# Patient Record
Sex: Male | Born: 1995
Health system: Southern US, Community
[De-identification: ages and names within clinical notes are randomized; demographics above are authoritative.]

## PROBLEM LIST (undated history)

## (undated) DIAGNOSIS — F909 Attention-deficit hyperactivity disorder, unspecified type: Secondary | ICD-10-CM

## (undated) HISTORY — DX: Attention-deficit hyperactivity disorder, unspecified type: F90.9

---
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
## 2016-11-11 ENCOUNTER — Ambulatory Visit (INDEPENDENT_AMBULATORY_CARE_PROVIDER_SITE_OTHER): Payer: BLUE CROSS/BLUE SHIELD | Admitting: Physician Assistant

## 2016-11-11 ENCOUNTER — Encounter: Payer: Self-pay | Admitting: Physician Assistant

## 2016-11-11 VITALS — BP 115/73 | HR 57 | Temp 98.3°F | Resp 16 | Ht 72.5 in | Wt 222.6 lb

## 2016-11-11 DIAGNOSIS — F909 Attention-deficit hyperactivity disorder, unspecified type: Secondary | ICD-10-CM | POA: Insufficient documentation

## 2016-11-11 DIAGNOSIS — L03211 Cellulitis of face: Secondary | ICD-10-CM

## 2016-11-11 DIAGNOSIS — F902 Attention-deficit hyperactivity disorder, combined type: Secondary | ICD-10-CM | POA: Diagnosis not present

## 2016-11-11 DIAGNOSIS — G4709 Other insomnia: Secondary | ICD-10-CM | POA: Diagnosis not present

## 2016-11-11 DIAGNOSIS — G4700 Insomnia, unspecified: Secondary | ICD-10-CM | POA: Insufficient documentation

## 2016-11-11 MED ORDER — METHYLPHENIDATE HCL ER (OSM) 18 MG PO TBCR: 18.0000 mg | EXTENDED_RELEASE_TABLET | Freq: Every day | ORAL | 0 refills | Status: DC

## 2016-11-11 MED ORDER — DOXYCYCLINE HYCLATE 100 MG PO CAPS: 100.0000 mg | ORAL_CAPSULE | Freq: Two times a day (BID) | ORAL | 0 refills | Status: AC

## 2016-11-11 NOTE — Patient Instructions (Signed)
     IF you received an x-ray today, you will receive an invoice from [facility]. Please contact [facility] at [phone] with questions or concerns regarding your invoice.   IF you received labwork today, you will receive an invoice from [facility]. Please contact [facility] at [phone] with questions or concerns regarding your invoice.   Our billing staff will not be able to assist you with questions regarding bills from these companies.  You will be contacted with the lab results as soon as they are available. The fastest way to get your results is to activate your My Chart account. Instructions are located on the last page of this paperwork. If you have not heard from us regarding the results in 2 weeks, please contact this office.     

## 2016-11-11 NOTE — Progress Notes (Signed)
Subjective:    Patient ID: Anthony Hahn, male    DOB: 03/14/1996, 21 y.o.   MRN: [id number]  HPI Presenting for swelling underneath his right eye. He is accompanied by his mother.  Patient woke up around 8-9AM with worsening discomfort and swelling beneath his right eye. Upon looking in the mirror, he saw that it was red. He has not taken any medications for this. Denies difficulty breathing, handling own secretions, or swelling elsewhere. Denies headache, ear pain, ear pressure, congestion, sneezing or coughing. Does endorse what feels like some water in his left ear.  When asked about yesterday's activities, patient reports going swimming in a pool for 2 hours in the early afternoon. He had never been to this pool before. He went to work yesterday evening feeling fine. Last night, he was able to fall asleep without difficulty. He notes this is abnormal for him given his h/o insomnia and ADHD.  He and his mother express concern regarding patient's insomnia. He often falls asleep around 7AM due to racing thoughts. He has tried counting sheep, relaxation methods, turning lights off around the house, etc. Mother used to give him melatonin to help.  He is no longer on ADHD medication (Daytrana patch) following a lapse in health insurance that made it too costly. He states his ADHD is hyperactive-inattentive type and is poorly controlled.   There are no active problems to display for this patient.  Prior to Admission medications   Medication Sig Start Date End Date Taking? Authorizing Provider  loratadine (CLARITIN) 10 MG tablet Take 10 mg by mouth daily as needed. For allergies.     [provider]  Multiple Vitamins-Minerals (MULTIVITAMINS THER. W/MINERALS) TABS Take 1 tablet by mouth daily.      [provider]   No Known Allergies    Review of Systems See above.     Objective:   Physical Exam  Constitutional: He appears well-developed and well-nourished.  BP  115/73   Pulse (!) 57   Temp 98.3 F (36.8 C) (Oral)   Resp 16   Ht 6' 0.5" (1.842 m)   Wt 222 lb 9.6 oz (101 kg)   SpO2 99%   BMI 29.78 kg/m    HENT:  Head: Normocephalic and atraumatic.  Right Ear: External ear normal.  Left Ear: External ear normal.  Did not perform otoscopic exam as otoscope light was out.  Eyes: Conjunctivae and EOM are normal. Pupils are equal, round, and reactive to light. Right eye exhibits no chemosis, no discharge, no exudate and no hordeolum. No foreign body present in the right eye. Left eye exhibits no chemosis, no discharge, no exudate and no hordeolum. No foreign body present in the left eye. Right conjunctiva is not injected. Left conjunctiva is not injected. No scleral icterus.  Semicircular area of erythema and edema with mild tenderness to palpation. 1x1 cm area of open skin at the 7 o' clock border with indurated edges - appears to be recently popped closed comedone. Scattered closed comedones and cystic acne on forehead, chin, and cheeks bilaterally.  Neck: Normal range of motion. Neck supple.  Cardiovascular: Normal rate, regular rhythm and normal heart sounds.   Pulmonary/Chest: Effort normal and breath sounds normal. No respiratory distress.  Lymphadenopathy:    He has no cervical adenopathy.  Neurological: He is alert.  Skin: Skin is warm and dry.  Psychiatric: He has a normal mood and affect. His behavior is normal.  Assessment & Plan:   1. Cellulitis, face Rx Doxycycline 100mg  oral twice daily x10 days. Encouraged application of hot compress to right eye. Follow  - doxycycline (VIBRAMYCIN) 100 MG capsule; Take 1 capsule (100 mg total) by mouth 2 (two) times daily.  Dispense: 20 capsule; Refill: 0  2. Attention deficit hyperactivity disorder (ADHD), combined type Advised calling if symptoms do not improve with methylphenidate tablets. Can increase dose or try switching to Daytrana patch, as this worked well for him as a child.  Referral given to [facility]. - methylphenidate 18 MG PO CR tablet; Take 1 tablet (18 mg total) by mouth daily.  Dispense: 30 tablet; Refill: 0 - Ambulatory referral to Psychiatry  3. Other insomnia Discussed optimal control of ADHD is likely to decrease insomnia. May pursue additional therapy following initiation of methylphenidate and psychiatry referral.

## 2016-11-11 NOTE — Assessment & Plan Note (Signed)
At least in part due to untreated ADHD. Anticipate some improvement with treatment.

## 2016-11-11 NOTE — Assessment & Plan Note (Signed)
Trial of oral methylphenidate CR 18 mg daily. [facility]er [facility]. If needed, I am happy to refill the stimulant or increase the dose until he gets in with specialty care.

## 2016-11-11 NOTE — Progress Notes (Signed)
Patient ID: Anthony Hahn, male    DOB: 1996-03-14, 21 y.o.   MRN: [id number]  PCP: Patient, No Pcp Per  Chief Complaint  Patient presents with  . Facial Swelling    Around right eye - began this am    Subjective:   Presents for evaluation of swelling under the RIGHT eye. He is accompanied by his mother.  He awoke this AM with swelling and discomfort under the eye. He looked in the mirror and saw that it was red. He applied nothing, and took noting to alleviate his symptoms. Relates that he may have squeezed what he thought was a pimple in this area, but nothing came out.  No new products, foods, medications. No exposures to chemicals that he can recall. He went swimming yesterday at a pool he'd not been to previously.  He'd also like to talk about chronic insomnia. This has been an issue all his life. His mother struggled when he was a younger child, and throughout his adolescent. She relates a time when he was in grade school and didn't sleep for 3 days straight. He continued to attend school and exercise, even lifting his bed frame in order to burn off excess energy. She wasn't able to lift the frame herself.  Each thing they've tried for this problem has worked briefly, then become ineffective after several days.  Mom recalls that when he was on stimulant therapy for ADHD, his sleep was some improved. Daytrana worked best for him, and it was stopped when he was temporarily uninsured. Mom relates that the oral formulations were ineffective, and that she finds transdermal formulations work better for her as well. The patient is not interested in transdermal products, recalling skin irritation as a child from the patches.   ADHD symptoms are negatively impacting his life even now. He has difficulty completing tasks and staying on track. He is interested in resuming treatment.   Review of Systems  Constitutional: Negative for chills and fever.  Eyes: Positive for redness  (lower eyelid).  Respiratory: Negative for cough and shortness of breath.   Cardiovascular: Negative for chest pain, palpitations and leg swelling.  Gastrointestinal: Negative for diarrhea, nausea and vomiting.  Endocrine: Negative for polydipsia.  Genitourinary: Negative for dysuria, frequency and urgency.  Musculoskeletal: Negative for myalgias.  Skin: Positive for color change and wound. Negative for rash.  Neurological: Negative for dizziness and headaches.  Psychiatric/Behavioral: Positive for decreased concentration and sleep disturbance. Negative for agitation, confusion, dysphoric mood, hallucinations, self-injury and suicidal ideas. The patient is hyperactive. The patient is not nervous/anxious.      Patient Active Problem List   Diagnosis Date Noted  . Attention deficit hyperactivity disorder (ADHD) 11/11/2016  . Insomnia 11/11/2016     Prior to Admission medications   Medication Sig Start Date End Date Taking? Authorizing Provider  loratadine (CLARITIN) 10 MG tablet Take 10 mg by mouth daily as needed. For allergies.     [provider]  Multiple Vitamins-Minerals (MULTIVITAMINS THER. W/MINERALS) TABS Take 1 tablet by mouth daily.      [provider]     No Known Allergies     Objective:  Physical Exam  Constitutional: He is oriented to person, place, and time. He appears well-developed and well-nourished. He is active and cooperative. No distress.  BP 115/73   Pulse (!) 57   Temp 98.3 F (36.8 C) (Oral)   Resp 16   Ht 6' 0.5" (1.842 m)   Wt  222 lb 9.6 oz (101 kg)   SpO2 99%   BMI 29.78 kg/m   HENT:  Head: Normocephalic and atraumatic.    Right Ear: Hearing normal.  Left Ear: Hearing normal.  Eyes: Conjunctivae are normal. No scleral icterus.  Neck: Normal range of motion. Neck supple. No thyromegaly present.  Cardiovascular: Normal rate, regular rhythm and normal heart sounds.   Pulses:      Radial pulses are 2+ on the right side,  and 2+ on the left side.  Pulmonary/Chest: Effort normal and breath sounds normal.  Lymphadenopathy:       Head (right side): No tonsillar, no preauricular, no posterior auricular and no occipital adenopathy present.       Head (left side): No tonsillar, no preauricular, no posterior auricular and no occipital adenopathy present.    He has no cervical adenopathy.       Right: No supraclavicular adenopathy present.       Left: No supraclavicular adenopathy present.  Neurological: He is alert and oriented to person, place, and time. No sensory deficit.  Skin: Skin is warm, dry and intact. Lesion (multiple scattered open comedones and cystic acne lesions.) noted. No rash noted. No cyanosis or erythema. Nails show no clubbing.  Psychiatric: He has a normal mood and affect. His speech is normal and behavior is normal.       Assessment & Plan:   Problem List Items Addressed This Visit    Attention deficit hyperactivity disorder (ADHD)    Trial of oral methylphenidate CR 18 mg daily. Refer to [facility]. If needed, I am happy to refill the stimulant or increase the dose until he gets in with specialty care.      Relevant Medications   methylphenidate 18 MG PO CR tablet   Other Relevant Orders   Ambulatory referral to Psychiatry   Insomnia    At least in part due to untreated ADHD. Anticipate some improvement with treatment.       Other Visit Diagnoses    Cellulitis, face    -  Primary   Doxycycline. Warm compresses. Local wound care. RTC if worsens/persists.   Relevant Medications   doxycycline (VIBRAMYCIN) 100 MG capsule       No Follow-up on file.   Fernande Brashelle S. Bralyn Folkert, PA-C [facility]

## 2016-11-15 ENCOUNTER — Telehealth: Payer: Self-pay

## 2016-11-15 NOTE — Telephone Encounter (Signed)
PA Form faxed Awaiting response

## 2016-11-15 NOTE — Telephone Encounter (Signed)
PA Started. Form Placed in box Please see highlighted items

## 2016-11-23 NOTE — Telephone Encounter (Signed)
PA for Methylphenidate Appro[address] [phone]

## 2016-12-07 ENCOUNTER — Telehealth: Payer: Self-pay | Admitting: Physician Assistant

## 2016-12-07 MED ORDER — METHYLPHENIDATE HCL ER (OSM) 18 MG PO TBCR: 18.0000 mg | EXTENDED_RELEASE_TABLET | Freq: Every day | ORAL | 0 refills | Status: DC

## 2016-12-07 NOTE — Telephone Encounter (Signed)
Fax from the insurance that methylphenidate er has been denied because she has not tried Concerta (same medication, BRAND NAME).  As such, I've printed that for her to try. If it doesn't work, or if she has side effects, then we can try again.  Meds ordered this encounter  Medications  . methylphenidate (CONCERTA) 18 MG PO CR tablet    Sig: Take 1 tablet (18 mg total) by mouth daily.    Dispense:  30 tablet    Refill:  0    Order Specific Question:   Supervising Provider    Answer:   Clelia CroftSHAW, EVA N [[id number]]

## 2016-12-09 ENCOUNTER — Telehealth: Payer: Self-pay

## 2016-12-09 NOTE — Telephone Encounter (Signed)
Left voicemail for pt stating Rx is ready for pickup.

## 2016-12-09 NOTE — Telephone Encounter (Signed)
Concerta 18 MG ready for pickup at the [facility] [address]. Notified the pt via voicemail.

## 2017-02-09 MED ORDER — METHYLPHENIDATE HCL ER (OSM) 18 MG PO TBCR: 18.0000 mg | EXTENDED_RELEASE_TABLET | Freq: Every day | ORAL | 0 refills | Status: AC

## 2017-02-09 NOTE — Telephone Encounter (Signed)
Located prescription printed 12/07/2016 and never picked up. Destroyed, with Jonny Ruiz, RN as witness.  Mom reports that they had an appointment at [facility], which was cancelled by the specialty office. She doesn't know why and did not call back to schedule. She is undergoing treatment for cancer.

## 2017-02-09 NOTE — Telephone Encounter (Signed)
Mother presents to pick up Concerta Rx, printed 12/07/2016.  Unable to check [place], as that site is currently down.  Reviewed signature page in our office, but the prescription was not documented in the book for signature.  When I saw him in June, he was referred to [facility], and provided with a 30-day supply of generic methylphenidate (Concerta). There was a prior authorization issue with his insurance, and they required that he fail BRAND NAME Concerta before authorizing generic methylphenidate. That prescription was printed 12/07/2016.  Left a message at [facility] inquiring about the referral (was it received? Scheduled? Outcome?).  Called his pharmacy of record, and they have not filled this product for him, ever.  The pharmacist was kind enough to access the [place] site.  No fill dates in the past 24 months.  Meds ordered this encounter  Medications  . methylphenidate (CONCERTA) 18 MG PO CR tablet    Sig: Take 1 tablet (18 mg total) by mouth daily.    Dispense:  30 tablet    Refill:  0    Order Specific Question:   Supervising Provider    Answer:   Clelia Croft, EVA N [[id number]]

## 2017-02-09 NOTE — Addendum Note (Signed)
Addended by: Fernande Bras on: 02/09/2017 02:11 PM   Modules accepted: Orders

## 2017-02-11 ENCOUNTER — Telehealth: Payer: Self-pay | Admitting: Physician Assistant

## 2017-02-11 NOTE — Telephone Encounter (Signed)
FYI patient was a NO SHOW to her appointment

## 2017-02-11 NOTE — Telephone Encounter (Signed)
Please call this patient's mother and offer to help her get the patient rescheduled. I think that she is being treated for cancer, so she may be really busy with treatments, etc and dealing with her own illness.

## 2017-02-11 NOTE — Telephone Encounter (Signed)
Baird Lyons called from [facility] stated that pt no showed the appt and they can not get in contact with pt and has closed the referral.. Have any questions call casey at [phone]

## 2017-02-14 NOTE — Telephone Encounter (Signed)
Thanks

## 2017-02-16 ENCOUNTER — Telehealth: Payer: Self-pay | Admitting: Family Medicine

## 2017-02-16 NOTE — Telephone Encounter (Signed)
LEFT MESSAGE ON MOTHERS PHONE FOR HER TO GIVE [place] A CALL BACK TO TRY TO RESCHEDULE DAUGHTER REFERRAL SHE WAS A NO SHOW AT [facility] THEY CLOSED THE REFERRAL

## 2017-08-18 ENCOUNTER — Encounter: Payer: Self-pay | Admitting: Physician Assistant

## 2017-09-19 ENCOUNTER — Encounter: Payer: Self-pay | Admitting: Podiatry

## 2017-09-19 ENCOUNTER — Ambulatory Visit: Payer: BLUE CROSS/BLUE SHIELD | Admitting: Podiatry

## 2017-09-19 VITALS — BP 118/67 | HR 51

## 2017-09-19 DIAGNOSIS — B079 Viral wart, unspecified: Secondary | ICD-10-CM

## 2017-09-19 DIAGNOSIS — L84 Corns and callosities: Secondary | ICD-10-CM

## 2017-09-21 NOTE — Progress Notes (Signed)
Subjective:   Patient ID: Anthony Hahn, male   DOB: 22 y.o.   MRN: [id number]   HPI Patient presents stating he has a painful lesion on his left big toe that is been present for over a year he is tried to trim it and use over-the-counter medicines.  Patient does not smoke and likes to be active   Review of Systems  All other systems reviewed and are negative.       Objective:  Physical Exam  Constitutional: He appears well-developed and well-nourished.  Cardiovascular: Intact distal pulses.  Pulmonary/Chest: Effort normal.  Musculoskeletal: Normal range of motion.  Neurological: He is alert.  Skin: Skin is warm.  Nursing note and vitals reviewed.   Neurovascular status intact muscle strength is adequate range of motion within normal limits with keratotic lesion noted at the inner phalangeal joint left big toe with upon debridement lucent core noted within the lesion.  It is moderately painful to dorsal pressure and lateral pressure and patient was noted to have good digital perfusion and is well oriented x3     Assessment:  Keratotic lesion secondary to pressure with possibility of wart or porokeratotic type lesion     Plan:  H&P condition reviewed and deep debridement accomplished and went ahead today applied salicylic acid prep with sterile dressing we will see response to decide what else may be appropriate for this.  Reviewed x-rays with patient  X-rays indicate that there is no signs of interphalangeal sesamoid or other bone pathology

## 2018-12-23 ENCOUNTER — Emergency Department (HOSPITAL_COMMUNITY)
Admission: EM | Admit: 2018-12-23 | Discharge: 2018-12-24 | Disposition: A | Discharge status: Home / Self Care | Payer: Self-pay | Attending: Emergency Medicine | Admitting: Emergency Medicine

## 2018-12-23 ENCOUNTER — Other Ambulatory Visit: Payer: Self-pay

## 2018-12-23 ENCOUNTER — Encounter (HOSPITAL_COMMUNITY): Payer: Self-pay | Admitting: Emergency Medicine

## 2018-12-23 DIAGNOSIS — J45909 Unspecified asthma, uncomplicated: Secondary | ICD-10-CM | POA: Insufficient documentation

## 2018-12-23 DIAGNOSIS — F1210 Cannabis abuse, uncomplicated: Secondary | ICD-10-CM | POA: Insufficient documentation

## 2018-12-23 DIAGNOSIS — R112 Nausea with vomiting, unspecified: Secondary | ICD-10-CM | POA: Insufficient documentation

## 2018-12-23 DIAGNOSIS — R1013 Epigastric pain: Secondary | ICD-10-CM | POA: Insufficient documentation

## 2018-12-23 LAB — CBC
HCT: 42.6 % (ref 39.0–52.0)
Hemoglobin: 13.4 g/dL (ref 13.0–17.0)
MCH: 27.1 pg (ref 26.0–34.0)
MCHC: 31.5 g/dL (ref 30.0–36.0)
MCV: 86.2 fL (ref 80.0–100.0)
Platelets: 292 10*3/uL (ref 150–400)
RBC: 4.94 MIL/uL (ref 4.22–5.81)
RDW: 13.1 % (ref 11.5–15.5)
WBC: 7.6 10*3/uL (ref 4.0–10.5)
nRBC: 0 % (ref 0.0–0.2)

## 2018-12-23 LAB — COMPREHENSIVE METABOLIC PANEL
ALT: 34 U/L (ref 0–44)
AST: 32 U/L (ref 15–41)
Albumin: 4.2 g/dL (ref 3.5–5.0)
Alkaline Phosphatase: 57 U/L (ref 38–126)
Anion gap: 9 (ref 5–15)
BUN: 14 mg/dL (ref 6–20)
CO2: 27 mmol/L (ref 22–32)
Calcium: 9.1 mg/dL (ref 8.9–10.3)
Chloride: 104 mmol/L (ref 98–111)
Creatinine, Ser: 1.11 mg/dL (ref 0.61–1.24)
GFR calc Af Amer: >60 mL/min (ref >60)
GFR calc non Af Amer: >60 mL/min (ref >60)
Glucose, Bld: 91 mg/dL (ref 70–99)
Potassium: 3.7 mmol/L (ref 3.5–5.1)
Sodium: 140 mmol/L (ref 135–145)
Total Bilirubin: 0.5 mg/dL (ref 0.3–1.2)
Total Protein: 7.2 g/dL (ref 6.5–8.1)

## 2018-12-23 LAB — URINALYSIS, ROUTINE W REFLEX MICROSCOPIC
Bilirubin Urine: NEGATIVE
Glucose, UA: NEGATIVE mg/dL
Hgb urine dipstick: NEGATIVE
Ketones, ur: NEGATIVE mg/dL
Leukocytes,Ua: NEGATIVE
Nitrite: NEGATIVE
Protein, ur: NEGATIVE mg/dL
Specific Gravity, Urine: 1.017 (ref 1.005–1.030)
pH: 6 (ref 5.0–8.0)

## 2018-12-23 LAB — LIPASE, BLOOD: Lipase: 28 U/L (ref 11–51)

## 2018-12-23 MED ORDER — SODIUM CHLORIDE 0.9% FLUSH: 3.0000 mL | Freq: Once | INTRAVENOUS | Status: DC

## 2018-12-23 MED ORDER — PROMETHAZINE HCL 25 MG PO TABS: 25.0000 mg | ORAL_TABLET | Freq: Four times a day (QID) | ORAL | 0 refills | Status: AC | PRN

## 2018-12-23 NOTE — ED Triage Notes (Signed)
Pt reports sudden onset of vomiting with last episode at 2030 today. Pt reports emesis was liquid green in color.

## 2018-12-23 NOTE — Discharge Instructions (Signed)
Work up in the emergency department was reassuring. I suspect your symptoms are due to a viral infection or food poisoning. Symptoms of uncomplicated viral gastrointestinal infections usually last 24-48 hours, and slowly improve after 2-3 days.    Treatment includes symptoms control, oral hydration, prevention of spread of illness.   Phenerganfor nausea every 8 hours for the next 24 hours to avoid vomiting and tolerate fluids.  Acetaminophen (tylenol) for body aches, abdominal pain. Ibuprofen (advil, aleve, motrin) or aspirin products can be irritating to stomach, avoid these until you feel better.  Stay well hydrated with 1-2 L of water daily.  Start with bland, mild diet avoiding hot, spicy, greasy foods until you start to feel better and then can transition to normal diet. Avoid fruit or fruit juices as these can worsen diarrhea.  Norovirus and other stomach bugs are killed by alcohol and standard cleaning agents. Wash hands often and do not share utensils or drinks to avoid spread to family members.  Return for worsening abdominal pain, localized right upper or right lower abdominal pain, chest pain, shortness of breath, inability to tolerating fluids by mouth despite nausea medication, localized abdominal pain, blood in vomit or stool, blood in your urine, burning with urination, fever  See below for community resources where you may get tested for COVID   Link to [facility] Department for COVID testing and information: [employer] No cost COVID-19 test available  Website includes testing dates, times, resources  Health department may be able to do "administrative" COVID tests to return/obtain clearance to work  Link to [employer] for COVID-19: [employer] Website includes contact information and addresses, testing requirements and  cost Hours: Monday - Friday, 8 a.m. - 3:30 p.m   Local pharmacies (I[facility]e: [facility], [facility]) also offering COVID testing with some cost

## 2018-12-24 NOTE — ED Provider Notes (Signed)
[facility]-EMERGENCY DEPT Provider Note   CSN: [id number] Arrival date & time: 12/23/18  2106    History   Chief Complaint Chief Complaint  Patient presents with  . Emesis    HPI Anthony Hahn is a 23 y.o. male presents to ER for evaluation of sudden onset nausea, vomiting that happened around 2030 today while at work.  Patient states all of a sudden he had intense nausea and had 3-4 episodes of vomiting.  Nonbloody nonbilious, it was clear/yellow-tinged.  Since vomiting he feels better but has had intermittent bouts of nausea but no repeat emesis.  4 to 6 hours prior to the symptoms he had some fish that was well cooked.  Has had associated mild epigastric abdominal cramping but none currently.  He denies other recent exposures to suspicious or undercooked foods.  No sick contacts.  He denies any fevers, chills, diarrhea, constipation, dysuria.  No interventions.  No alleviating factors.  He is tolerating fluids.  Patient states that his boss told him he should get checked out because of COVID-19 pandemic.  Patient states that 3 or 4 of his coworkers have had COVID-19 but since he has gotten a [employer] tested and it has been negative.  No previous abdominal surgeries.     HPI  Past Medical History:  Diagnosis Date  . ADHD   . Asthma    pollen-induced    Patient Active Problem List   Diagnosis Date Noted  . Attention deficit hyperactivity disorder (ADHD) 11/11/2016  . Insomnia 11/11/2016    Past Surgical History:  Procedure Laterality Date  . ORIF ANKLE FRACTURE  04/16/2011   Procedure: OPEN REDUCTION INTERNAL FIXATION (ORIF) ANKLE FRACTURE;  Surgeon: Velna OchsPeter G Dalldorf, MD;  Location: [facility] OR;  Service: Orthopedics;  Laterality: Left;        Home Medications    Prior to Admission medications   Medication Sig Start Date End Date Taking? Authorizing Provider  loratadine (CLARITIN) 10 MG tablet Take 10 mg by mouth daily as needed. For allergies.      [provider]  methylphenidate (CONCERTA) 18 MG PO CR tablet Take 1 tablet (18 mg total) by mouth daily. Patient not taking: Reported on 09/19/2017 02/09/17   Porfirio OarJeffery, Chelle, PA  Multiple Vitamins-Minerals (MULTIVITAMINS THER. W/MINERALS) TABS Take 1 tablet by mouth daily.      [provider]  promethazine (PHENERGAN) 25 MG tablet Take 1 tablet (25 mg total) by mouth every 6 (six) hours as needed for nausea or vomiting. 12/23/18   Liberty HandyGibbons, Claudia J, PA-C    Family History History reviewed. No pertinent family history.  Social History Social History   Tobacco Use  . Smoking status: Never Smoker  . Smokeless tobacco: Never Used  Substance Use Topics  . Alcohol use: Yes  . Drug use: Yes    Types: Marijuana     Allergies   Patient has no known allergies.   Review of Systems Review of Systems  Gastrointestinal: Positive for abdominal pain, nausea and vomiting.  All other systems reviewed and are negative.    Physical Exam Updated Vital Signs BP (!) 142/84   Pulse 67   Temp 98.6 F (37 C) (Oral)   Resp 16   Ht 6\' 3"  (1.905 m)   Wt 84.4 kg   SpO2 98%   BMI 23.25 kg/m   Physical Exam Vitals signs and nursing note reviewed.  Constitutional:      Appearance: He is well-developed.  Comments: Non toxic.  HENT:     Head: Normocephalic and atraumatic.     Nose: Nose normal.  Eyes:     Conjunctiva/sclera: Conjunctivae normal.  Neck:     Musculoskeletal: Normal range of motion.  Cardiovascular:     Rate and Rhythm: Normal rate and regular rhythm.     Heart sounds: Normal heart sounds.  Pulmonary:     Effort: Pulmonary effort is normal.     Breath sounds: Normal breath sounds.  Abdominal:     General: Bowel sounds are normal.     Palpations: Abdomen is soft.     Tenderness: There is no abdominal tenderness.     Comments: No G/R/R. No suprapubic or CVA tenderness. Negative Murphy's and McBurney's  Musculoskeletal: Normal range of motion.   Skin:    General: Skin is warm and dry.     Capillary Refill: Capillary refill takes less than 2 seconds.  Neurological:     Mental Status: He is alert.  Psychiatric:        Behavior: Behavior normal.      ED Treatments / Results  Labs (all labs ordered are listed, but only abnormal results are displayed) Labs Reviewed  LIPASE, BLOOD  COMPREHENSIVE METABOLIC PANEL  CBC  URINALYSIS, ROUTINE W REFLEX MICROSCOPIC    EKG None  Radiology No results found.  Procedures Procedures (including critical care time)  Medications Ordered in ED Medications  sodium chloride flush (NS) 0.9 % injection 3 mL (0 mLs Intravenous Hold 12/23/18 2248)     Initial Impression / Assessment and Plan / ED Course  I have reviewed the triage vital signs and the nursing notes.  Pertinent labs & imaging results that were available during my care of the patient were reviewed by me and considered in my medical decision making (see chart for details).  Highest on ddx is viral process, gastroenteritis.    Less likely gastritis, GERD, COVID.    Exam reassuring with no abdominal tenderness, peritonitis, negative Murphy's and McBurney's.    No red flag symptoms of n/v such as fever, hematemesis, melena, signs of severe dehydration.  No recent abx or travel.    ER work up reviewed by me. Electrolytes, liver function tests, lipase and CBC are all normal today.   Low suspicion for intra-abdominal/pelvic emergency. Patient tolerated fluids in the ED. Patient did not have episodes of emesis in the ED.  Will defer IVF given tolerance of PO.  Given  Benign symptoms, labs I don't think emergent imaging or admission is indicated. Re-evaluation reassuring. Patient will be discharged at this time with antiemetic, oral hydration, diet changes.  Precautions given. Return precautions discussed.   Final Clinical Impressions(s) / ED Diagnoses   Final diagnoses:  Nausea and vomiting in adult    ED Discharge Orders          Ordered    promethazine (PHENERGAN) 25 MG tablet  Every 6 hours PRN     12/23/18 2355           Kinnie Feil, PA-C 12/24/18 0154    Quintella Reichert, MD 12/24/18 [id number]

## 2019-04-23 ENCOUNTER — Other Ambulatory Visit: Payer: Self-pay

## 2019-04-23 DIAGNOSIS — Z20822 Contact with and (suspected) exposure to covid-19: Secondary | ICD-10-CM

## 2019-04-24 LAB — NOVEL CORONAVIRUS, NAA: SARS-CoV-2, NAA: NOT DETECTED

## 2019-09-20 ENCOUNTER — Ambulatory Visit: Payer: Self-pay | Attending: Internal Medicine

## 2019-09-20 DIAGNOSIS — Z23 Encounter for immunization: Secondary | ICD-10-CM

## 2019-09-20 NOTE — Progress Notes (Signed)
   Covid-19 Vaccination Clinic  Name:  QUINTERIOUS WALRAVEN    MRN: [id number] DOB: 1995-06-06  09/20/2019  Mr. Zou was observed post Covid-19 immunization for 15 minutes without incident. He was provided with Vaccine Information Sheet and instruction to access the V-Safe system.   Mr. Caratachea was instructed to call 911 with any severe reactions post vaccine: Marland Kitchen Difficulty breathing  . Swelling of face and throat  . A fast heartbeat  . A bad rash all over body  . Dizziness and weakness   Immunizations Administered    Name Date Dose VIS Date Route   Pfizer COVID-19 Vaccine 09/20/2019 11:10 AM 0.3 mL 07/11/2018 Intramuscular   Manufacturer: [employer], [employer]   Lot: [id number]   NDC: [address]-6

## 2019-10-16 ENCOUNTER — Ambulatory Visit: Payer: Self-pay | Attending: Internal Medicine

## 2019-10-16 DIAGNOSIS — Z23 Encounter for immunization: Secondary | ICD-10-CM

## 2019-10-16 NOTE — Progress Notes (Signed)
   Covid-19 Vaccination Clinic  Name:  Anthony Hahn    MRN: [id number] DOB: [birth year]  10/16/2019  Anthony Hahn was observed post Covid-19 immunization for 15 minutes without incident. He was provided with Vaccine Information Sheet and instruction to access the V-Safe system.   Anthony Hahn was instructed to call 911 with any severe reactions post vaccine: Marland Kitchen Difficulty breathing  . Swelling of face and throat  . A fast heartbeat  . A bad rash all over body  . Dizziness and weakness   Immunizations Administered    Name Date Dose VIS Date Route   Pfizer COVID-19 Vaccine 10/16/2019 10:50 AM 0.3 mL 07/11/2018 Intramuscular   Manufacturer: [employer], [employer]   Lot: [id number]   NDC: [address]-4

## 2020-03-03 ENCOUNTER — Other Ambulatory Visit: Payer: Self-pay

## 2020-03-03 DIAGNOSIS — W540XXA Bitten by dog, initial encounter: Secondary | ICD-10-CM | POA: Insufficient documentation

## 2020-03-03 DIAGNOSIS — J45909 Unspecified asthma, uncomplicated: Secondary | ICD-10-CM | POA: Insufficient documentation

## 2020-03-03 DIAGNOSIS — S61251A Open bite of left index finger without damage to nail, initial encounter: Secondary | ICD-10-CM | POA: Insufficient documentation

## 2020-03-04 ENCOUNTER — Encounter (HOSPITAL_COMMUNITY): Payer: Self-pay | Admitting: Emergency Medicine

## 2020-03-04 ENCOUNTER — Emergency Department (HOSPITAL_COMMUNITY)
Admission: EM | Admit: 2020-03-04 | Discharge: 2020-03-04 | Disposition: A | Discharge status: Home / Self Care | Payer: Self-pay | Attending: Emergency Medicine | Admitting: Emergency Medicine

## 2020-03-04 ENCOUNTER — Emergency Department (HOSPITAL_COMMUNITY): Payer: Self-pay

## 2020-03-04 ENCOUNTER — Other Ambulatory Visit: Payer: Self-pay

## 2020-03-04 DIAGNOSIS — W540XXA Bitten by dog, initial encounter: Secondary | ICD-10-CM

## 2020-03-04 MED ORDER — IBUPROFEN 800 MG PO TABS
800.0000 mg | ORAL_TABLET | Freq: Once | ORAL | Status: AC
  Administered 2020-03-04: 800 mg via ORAL
  Filled 2020-03-04: qty 1

## 2020-03-04 MED ORDER — AMOXICILLIN-POT CLAVULANATE 875-125 MG PO TABS: 1.0000 | ORAL_TABLET | Freq: Two times a day (BID) | ORAL | 0 refills | Status: AC

## 2020-03-04 MED ORDER — SODIUM CHLORIDE 0.9 % IV SOLN
3.0000 g | Freq: Once | INTRAVENOUS | Status: AC
  Administered 2020-03-04: 3 g via INTRAVENOUS
  Filled 2020-03-04: qty 8

## 2020-03-04 MED ORDER — IBUPROFEN 800 MG PO TABS: 800.0000 mg | ORAL_TABLET | Freq: Three times a day (TID) | ORAL | 0 refills | Status: AC

## 2020-03-04 NOTE — ED Triage Notes (Signed)
Patient was bitten by a dog that has all his shots. Patient base of left index and middle finger is swollen. Patient also has bites on both of the fingers. Patient states that he got bit on this past Friday.

## 2020-03-04 NOTE — ED Provider Notes (Signed)
[facility]-EMERGENCY DEPT Provider Note   CSN: [id number] Arrival date & time: 03/03/20  2240     History Chief Complaint  Patient presents with  . Animal Bite    left index and middle finger    Anthony Hahn is a 24 y.o. male.  Patient presents to the emergency department with a chief complaint of dog bite.  He states that he was bitten by his dog on Friday (4 days ago).  The dog is current on its rabies vaccine.  Patient reports that he has had some redness and swelling around the bite wound today.  He reports moderate pain.  He denies any fevers or chills.  He has been applying topical antibiotic ointment and a bandage.  He denies any other associated symptoms.  The history is provided by the patient. No language interpreter was used.       Past Medical History:  Diagnosis Date  . ADHD   . Asthma    pollen-induced    Patient Active Problem List   Diagnosis Date Noted  . Attention deficit hyperactivity disorder (ADHD) 11/11/2016  . Insomnia 11/11/2016    Past Surgical History:  Procedure Laterality Date  . ORIF ANKLE FRACTURE  04/16/2011   Procedure: OPEN REDUCTION INTERNAL FIXATION (ORIF) ANKLE FRACTURE;  Surgeon: Velna Ochs, MD;  Location: [facility] OR;  Service: Orthopedics;  Laterality: Left;       History reviewed. No pertinent family history.  Social History   Tobacco Use  . Smoking status: Never Smoker  . Smokeless tobacco: Never Used  Substance Use Topics  . Alcohol use: Yes  . Drug use: Yes    Types: Marijuana    Home Medications Prior to Admission medications   Medication Sig Start Date End Date Taking? Authorizing Provider  loratadine (CLARITIN) 10 MG tablet Take 10 mg by mouth daily as needed. For allergies.     [provider]  methylphenidate (CONCERTA) 18 MG PO CR tablet Take 1 tablet (18 mg total) by mouth daily. Patient not taking: Reported on 09/19/2017 02/09/17   Porfirio Oar, PA  Multiple  Vitamins-Minerals (MULTIVITAMINS THER. W/MINERALS) TABS Take 1 tablet by mouth daily.      [provider]  promethazine (PHENERGAN) 25 MG tablet Take 1 tablet (25 mg total) by mouth every 6 (six) hours as needed for nausea or vomiting. 12/23/18   Liberty Handy, PA-C    Allergies    Patient has no known allergies.  Review of Systems   Review of Systems  All other systems reviewed and are negative.   Physical Exam Updated Vital Signs BP (!) 151/91 (BP Location: Right Arm)   Pulse 62   Temp 98.9 F (37.2 C) (Oral)   Resp 16   Ht 6\' 3"  (1.905 m)   Wt 81.6 kg   SpO2 98%   BMI 22.50 kg/m   Physical Exam Vitals and nursing note reviewed.  Constitutional:      General: He is not in acute distress.    Appearance: He is well-developed. He is not ill-appearing.  HENT:     Head: Normocephalic and atraumatic.  Eyes:     Conjunctiva/sclera: Conjunctivae normal.  Cardiovascular:     Rate and Rhythm: Normal rate.  Pulmonary:     Effort: Pulmonary effort is normal. No respiratory distress.  Abdominal:     General: There is no distension.  Musculoskeletal:     Cervical back: Neck supple.     Comments: Moves all  extremities  Skin:    General: Skin is warm and dry.     Comments: 1.5 cm laceration to the left hand at the base of the second digit near the MCP joint with some cloudy discharge, mild erythema extending peripherally from this laceration  Neurological:     Mental Status: He is alert and oriented to person, place, and time.  Psychiatric:        Mood and Affect: Mood normal.        Behavior: Behavior normal.       ED Results / Procedures / Treatments   Labs (all labs ordered are listed, but only abnormal results are displayed) Labs Reviewed - No data to display  EKG None  Radiology DG Hand Complete Left  Result Date: 03/04/2020 CLINICAL DATA:  24 year old male status post dog bite on 02/29/2020. Increased hand swelling and pain. EXAM: LEFT HAND -  COMPLETE 3+ VIEW COMPARISON:  None. FINDINGS: Bone mineralization is within normal limits. There is no evidence of fracture or dislocation. There is no evidence of arthropathy or other focal bone abnormality. Generalized soft tissue swelling. No soft tissue gas. No radiopaque foreign body identified. IMPRESSION: Soft tissue swelling with no acute osseous abnormality or radiopaque foreign body identified. Electronically Signed   By: Odessa Fleming M.D.   On: 03/04/2020 01:39    Procedures Procedures (including critical care time)  Medications Ordered in ED Medications  Ampicillin-Sulbactam (UNASYN) 3 g in sodium chloride 0.9 % 100 mL IVPB (has no administration in time range)  ibuprofen (ADVIL) tablet 800 mg (has no administration in time range)    ED Course  I have reviewed the triage vital signs and the nursing notes.  Pertinent labs & imaging results that were available during my care of the patient were reviewed by me and considered in my medical decision making (see chart for details).    MDM Rules/Calculators/A&P                          Patient with dog bite that occurred 4 days ago.  He has a small laceration with some surrounding erythema.  When I press firmly around the wound, there is a scant amount of cloudy discharge that is able to be expressed, but further palpation did not elicit any further discharge.  At this time, do not feel that further incision or exploration is needed, but I do think that the patient needs to be placed on antibiotics.  The injury is on the posterior aspect of the hand, I have a low suspicion for septic joint or for flexor tenosynovitis.  I will give him a dose of Unasyn in the ED and discharge him home on Augmentin.  The dog is current on its rabies shots.  Patient will be placed in a finger splint to limit mobility.  I have advised him to keep his hand elevated.  I have marked the area with a skin marker.  Strict return precautions have been given. Final  Clinical Impression(s) / ED Diagnoses Final diagnoses:  Dog bite, initial encounter    Rx / DC Orders ED Discharge Orders         Ordered    amoxicillin-clavulanate (AUGMENTIN) 875-125 MG tablet  Every 12 hours        03/04/20 0247    ibuprofen (ADVIL) 800 MG tablet  3 times daily        03/04/20 0247  Roxy Horseman, PA-C 03/04/20 0248    Dione Booze, MD 03/04/20 [phone]

## 2020-03-04 NOTE — Discharge Instructions (Signed)
Return to the emergency department if your symptoms significantly worsen.  You may notice slight worsening over the next 24 to 48 hours, but the antibiotic should limit this.  You should begin improving significantly after ~48 hours with the antibiotic.

## 2020-03-05 ENCOUNTER — Ambulatory Visit: Payer: Self-pay

## 2020-12-18 ENCOUNTER — Other Ambulatory Visit: Payer: Self-pay

## 2020-12-18 ENCOUNTER — Emergency Department (HOSPITAL_COMMUNITY)
Admission: EM | Admit: 2020-12-18 | Discharge: 2020-12-18 | Disposition: A | Discharge status: Home / Self Care | Payer: Self-pay | Attending: Emergency Medicine | Admitting: Emergency Medicine

## 2020-12-18 ENCOUNTER — Emergency Department (HOSPITAL_COMMUNITY): Payer: Self-pay

## 2020-12-18 ENCOUNTER — Encounter (HOSPITAL_COMMUNITY): Payer: Self-pay | Admitting: *Deleted

## 2020-12-18 DIAGNOSIS — M93279 Osteochondritis dissecans, unspecified ankle and joints of foot: Secondary | ICD-10-CM | POA: Insufficient documentation

## 2020-12-18 DIAGNOSIS — M79641 Pain in right hand: Secondary | ICD-10-CM | POA: Insufficient documentation

## 2020-12-18 DIAGNOSIS — M899 Disorder of bone, unspecified: Secondary | ICD-10-CM

## 2020-12-18 DIAGNOSIS — J45909 Unspecified asthma, uncomplicated: Secondary | ICD-10-CM | POA: Insufficient documentation
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
## 2020-12-18 NOTE — Discharge Instructions (Addendum)
You are seen in the ER today for your hand pain.  You are found to have findings concerning for injury to the tendon or underlying bone of your knuckle.  As this injury occurred approximately 4 months ago, there is no intervention required today, but do recommend he follow-up with Dr. Orlan Leavens, the hand specialist listed below.  Please call his office to schedule a follow-up appointment.  You may continue to ice the joint, use over-the-counter medications for pain as needed, and I would recommend that you avoid any activities that significantly worsen your pain such as boxing as you mentioned throughout our discussion.  Return to the ER if develop new numbness, ting, weakness in the hand, or any other new severe symptoms.

## 2021-04-22 ENCOUNTER — Encounter (HOSPITAL_COMMUNITY): Payer: Self-pay | Admitting: Oncology

## 2021-04-22 ENCOUNTER — Emergency Department (HOSPITAL_COMMUNITY)
Admission: EM | Admit: 2021-04-22 | Discharge: 2021-04-22 | Disposition: A | Discharge status: Home / Self Care | Payer: Self-pay | Attending: Emergency Medicine | Admitting: Emergency Medicine

## 2021-04-22 ENCOUNTER — Other Ambulatory Visit: Payer: Self-pay

## 2021-04-22 ENCOUNTER — Emergency Department (HOSPITAL_COMMUNITY): Payer: Self-pay

## 2021-04-22 DIAGNOSIS — J45909 Unspecified asthma, uncomplicated: Secondary | ICD-10-CM | POA: Insufficient documentation

## 2021-04-22 DIAGNOSIS — J101 Influenza due to other identified influenza virus with other respiratory manifestations: Secondary | ICD-10-CM | POA: Insufficient documentation

## 2021-04-22 DIAGNOSIS — J3489 Other specified disorders of nose and nasal sinuses: Secondary | ICD-10-CM | POA: Insufficient documentation

## 2021-04-22 DIAGNOSIS — Z20822 Contact with and (suspected) exposure to covid-19: Secondary | ICD-10-CM | POA: Insufficient documentation

## 2021-04-22 LAB — RESP PANEL BY RT-PCR (FLU A&B, COVID) ARPGX2
Influenza A by PCR: POSITIVE — AB
Influenza B by PCR: NEGATIVE
SARS Coronavirus 2 by RT PCR: NEGATIVE

## 2021-04-22 NOTE — ED Provider Notes (Signed)
[facility]-EMERGENCY DEPT Provider Note   CSN: [id number] Arrival date & time: 04/22/21  1452     History Chief Complaint  Patient presents with   Cough    Anthony Hahn is a 25 y.o. male with a past medical history of asthma who presents to the ED complaining of productive cough onset yesterday.  Patient has associated nasal congestion, rhinorrhea, myalgias, sore throat, chills, subjective fever.  He has tried over-the-counter NyQuil with relief of his symptoms.  He denies chest pain, shortness of breath, abdominal pain, nausea, vomiting, ear pain, trouble swallowing.  Denies sick contacts.  The history is provided by the patient. No language interpreter was used.      Past Medical History:  Diagnosis Date   ADHD    Asthma    pollen-induced    Patient Active Problem List   Diagnosis Date Noted   Attention deficit hyperactivity disorder (ADHD) 11/11/2016   Insomnia 11/11/2016    Past Surgical History:  Procedure Laterality Date   ORIF ANKLE FRACTURE  04/16/2011   Procedure: OPEN REDUCTION INTERNAL FIXATION (ORIF) ANKLE FRACTURE;  Surgeon: Velna Ochs, MD;  Location: [facility] OR;  Service: Orthopedics;  Laterality: Left;       No family history on file.  Social History   Tobacco Use   Smoking status: Never   Smokeless tobacco: Never  Vaping Use   Vaping Use: Never used  Substance Use Topics   Alcohol use: Yes   Drug use: Yes    Types: Marijuana    Home Medications Prior to Admission medications   Medication Sig Start Date End Date Taking? Authorizing Provider  amoxicillin-clavulanate (AUGMENTIN) 875-125 MG tablet Take 1 tablet by mouth every 12 (twelve) hours. 03/04/20   Roxy Horseman, PA-C  ibuprofen (ADVIL) 800 MG tablet Take 1 tablet (800 mg total) by mouth 3 (three) times daily. 03/04/20   Roxy Horseman, PA-C  loratadine (CLARITIN) 10 MG tablet Take 10 mg by mouth daily as needed. For allergies.     [provider]   methylphenidate (CONCERTA) 18 MG PO CR tablet Take 1 tablet (18 mg total) by mouth daily. Patient not taking: Reported on 09/19/2017 02/09/17   Porfirio Oar, PA  Multiple Vitamins-Minerals (MULTIVITAMINS THER. W/MINERALS) TABS Take 1 tablet by mouth daily.      [provider]  promethazine (PHENERGAN) 25 MG tablet Take 1 tablet (25 mg total) by mouth every 6 (six) hours as needed for nausea or vomiting. 12/23/18   Liberty Handy, PA-C    Allergies    Patient has no known allergies.  Review of Systems   Review of Systems  Constitutional:  Positive for fever (subjective). Negative for chills.  HENT:  Positive for congestion, rhinorrhea and sore throat. Negative for trouble swallowing.   Respiratory:  Positive for cough.   Cardiovascular:  Negative for chest pain.  Gastrointestinal:  Negative for abdominal pain, nausea and vomiting.  Musculoskeletal:  Positive for myalgias. Negative for gait problem.  Skin:  Negative for color change and wound.  Allergic/Immunologic: Negative for immunocompromised state.  All other systems reviewed and are negative.  Physical Exam Updated Vital Signs BP 113/61 (BP Location: Left Arm)   Pulse 94   Temp 98.2 F (36.8 C) (Oral)   Resp 18   Ht 6\' 4"  (1.93 m)   Wt 83.9 kg   SpO2 94%   BMI 22.52 kg/m   Physical Exam Vitals and nursing note reviewed.  Constitutional:  General: He is not in acute distress.    Appearance: He is not diaphoretic.  HENT:     Head: Normocephalic and atraumatic.     Nose: Nose normal.     Mouth/Throat:     Mouth: Mucous membranes are moist.     Pharynx: Oropharynx is clear. No oropharyngeal exudate.  Eyes:     General: No scleral icterus.    Conjunctiva/sclera: Conjunctivae normal.  Cardiovascular:     Rate and Rhythm: Normal rate and regular rhythm.     Pulses: Normal pulses.     Heart sounds: Normal heart sounds.  Pulmonary:     Effort: Pulmonary effort is normal. No respiratory distress.      Breath sounds: Normal breath sounds. No wheezing.  Abdominal:     General: Bowel sounds are normal.     Palpations: Abdomen is soft. There is no mass.     Tenderness: There is no abdominal tenderness. There is no guarding or rebound.  Musculoskeletal:        General: Normal range of motion.     Cervical back: Normal range of motion and neck supple.  Skin:    General: Skin is warm and dry.  Neurological:     Mental Status: He is alert.  Psychiatric:        Behavior: Behavior normal.    ED Results / Procedures / Treatments   Labs (all labs ordered are listed, but only abnormal results are displayed) Labs Reviewed  RESP PANEL BY RT-PCR (FLU A&B, COVID) ARPGX2 - Abnormal; Notable for the following components:      Result Value   Influenza A by PCR POSITIVE (*)    All other components within normal limits    EKG None  Radiology DG Chest 2 View  Result Date: 04/22/2021 CLINICAL DATA:  Cough. EXAM: CHEST - 2 VIEW COMPARISON:  None. FINDINGS: The heart size and mediastinal contours are within normal limits. Both lungs are clear. The visualized skeletal structures are unremarkable. IMPRESSION: No active cardiopulmonary disease. Electronically Signed   By: Darliss Cheney M.D.   On: 04/22/2021 17:35    Procedures Procedures   Medications Ordered in ED Medications - No data to display  ED Course  I have reviewed the triage vital signs and the nursing notes.  Pertinent labs & imaging results that were available during my care of the patient were reviewed by me and considered in my medical decision making (see chart for details).  Clinical Course as of 04/22/21 [place]on Gully  Wed Apr 22, 2021  1840 Patient reassessed and resting comfortably on stretcher.  Patient notified of labs and imaging findings. Patient agreeable to discharge treatment plan. Pt appears safe for discharge at this time. [SB]    Clinical Course User Index [SB] Katniss Weedman A, PA-C   MDM Rules/Calculators/A&P                          Patient with cough, nasal congestion, myalgias, sore throat since yesterday.  Denies known sick contacts.  Vital signs stable, patient afebrile, oxygen saturation 94%. Differential diagnosis includes COVID, Flu, pneumonia, or viral URI with cough. On exam patient without acute cardiovascular, pulmonary, abdominal exam findings.  Swabbed for COVID, flu.  Results showed positive for influenza A.  Influenza as likely etiology.  Patient is otherwise healthy and not immunocompromised.  Repeat vital signs show patient afebrile, oxygen saturation at 98%.  Discussed with patient regarding use of Tamiflu and discussed thoroughly  on its risks and benefits. Follow discussion, patient opted for supportive care at this time. Work note provided. Discussed with patient that they should not attend work until they are fever free for 24 hours.  Discussed with patient that they can follow-up with her primary care provider if continuing symptoms.  Supportive care and strict return precautions discussed with patient.  Patient acknowledges and voices understanding. Appears safe for discharge at this time.  Follow-up as indicated in discharge paperwork.   Final Clinical Impression(s) / ED Diagnoses Final diagnoses:  Influenza A    Rx / DC Orders ED Discharge Orders     None        Boleslaus Holloway A, PA-C 04/22/21 1906    Wynetta Fines, MD 04/22/21 [phone]

## 2021-04-22 NOTE — ED Notes (Signed)
An After Visit Summary was printed and given to the patient. Discharge instructions given and no further questions at this time.  

## 2021-04-22 NOTE — Discharge Instructions (Addendum)
Your swab was positive for flu.  Your chest x-ray was negative for pneumonia.  You may continue have taken DayQuil and NyQuil as directed for your symptoms.  You may follow-up with your primary care provider as needed.  Return to the emergency department if you are experiencing trouble breathing, decreased fluid intake, or worsening symptoms.

## 2021-04-22 NOTE — ED Triage Notes (Signed)
Pt reports cough, congestion, generalized body aches, sore throat, chills that began yesterday.  Using OTC medications that are helping w/ sx.

## 2022-07-29 IMAGING — CR DG CHEST 2V
2 series · 2 of 2 positions shown · non-contrast
Comparison: None.

CLINICAL DATA: Cough.

EXAM:
CHEST - 2 VIEW

[w chest pa]
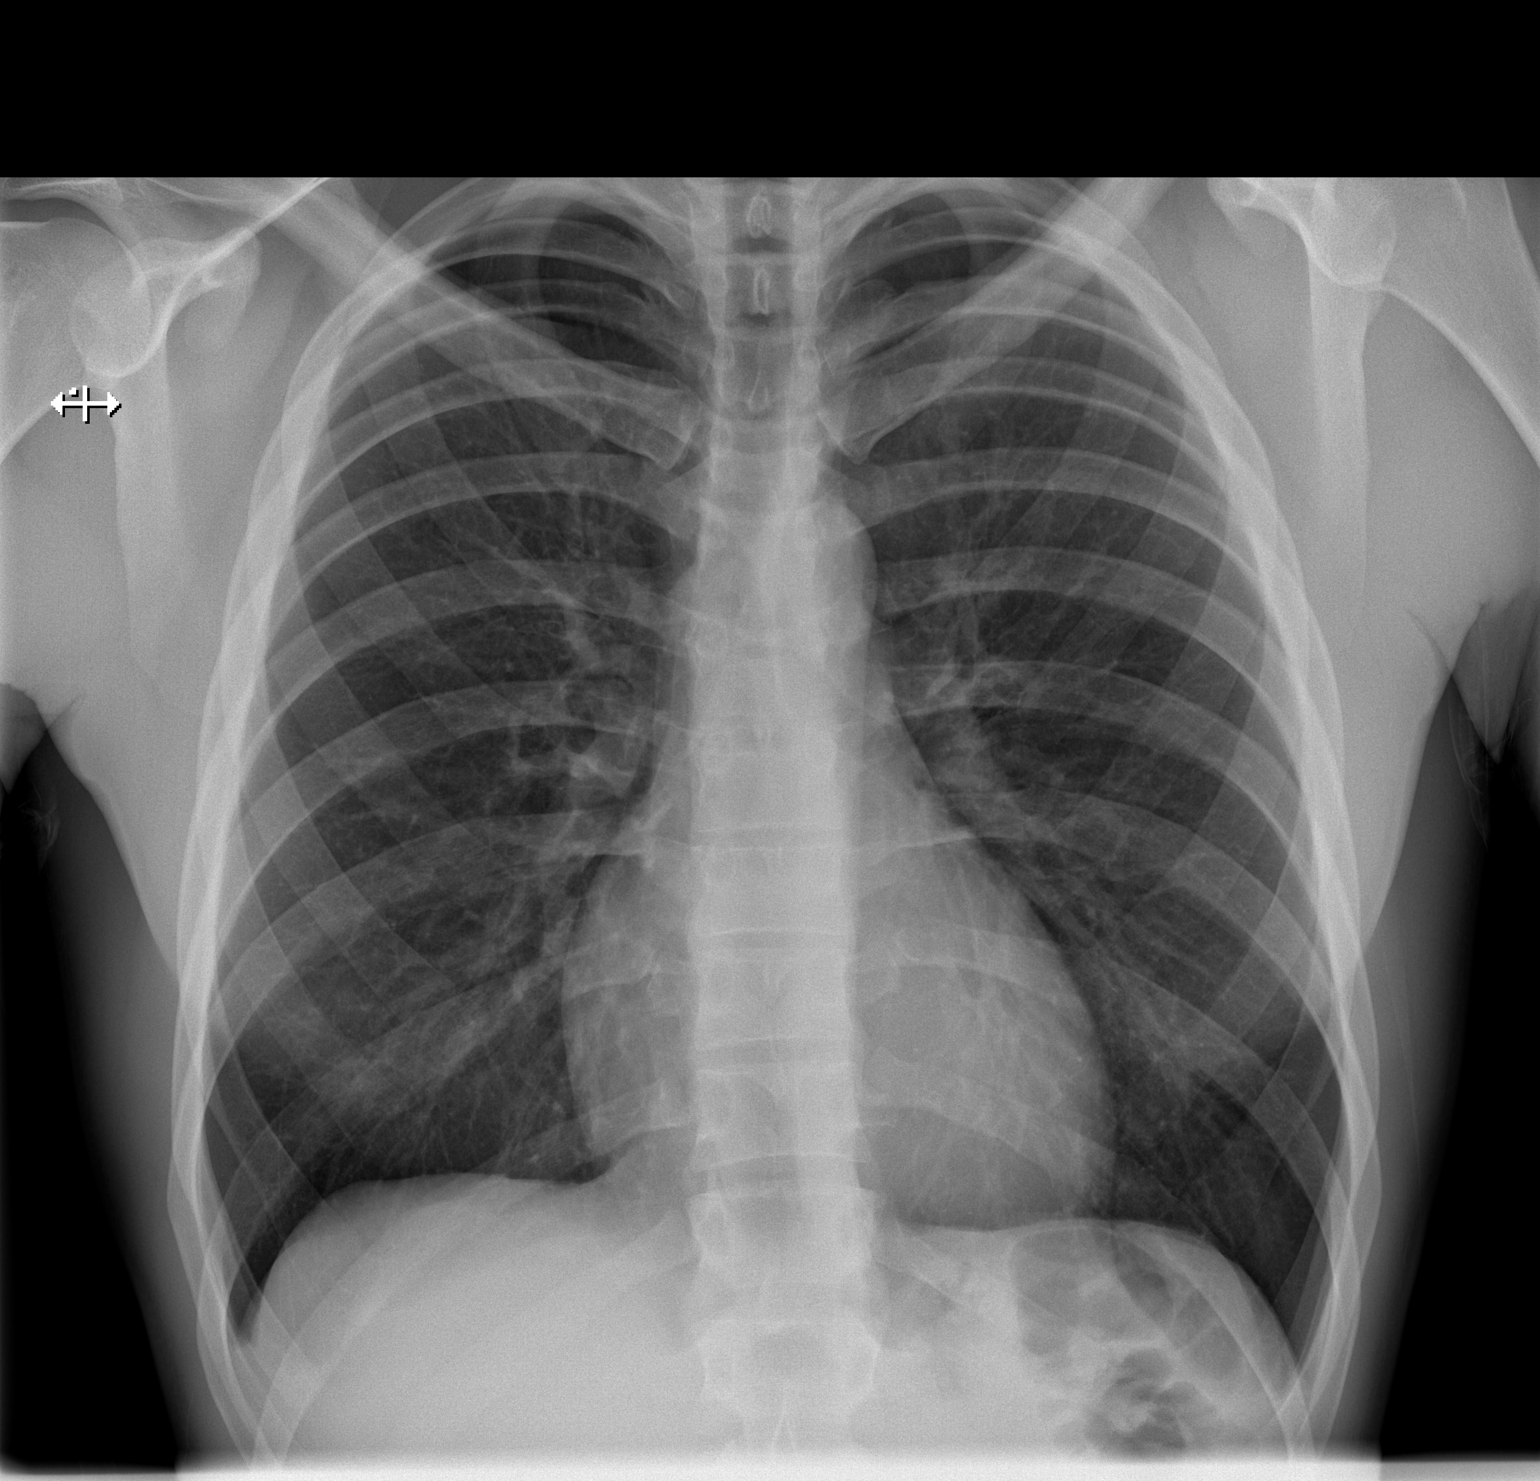

[w chest lat]
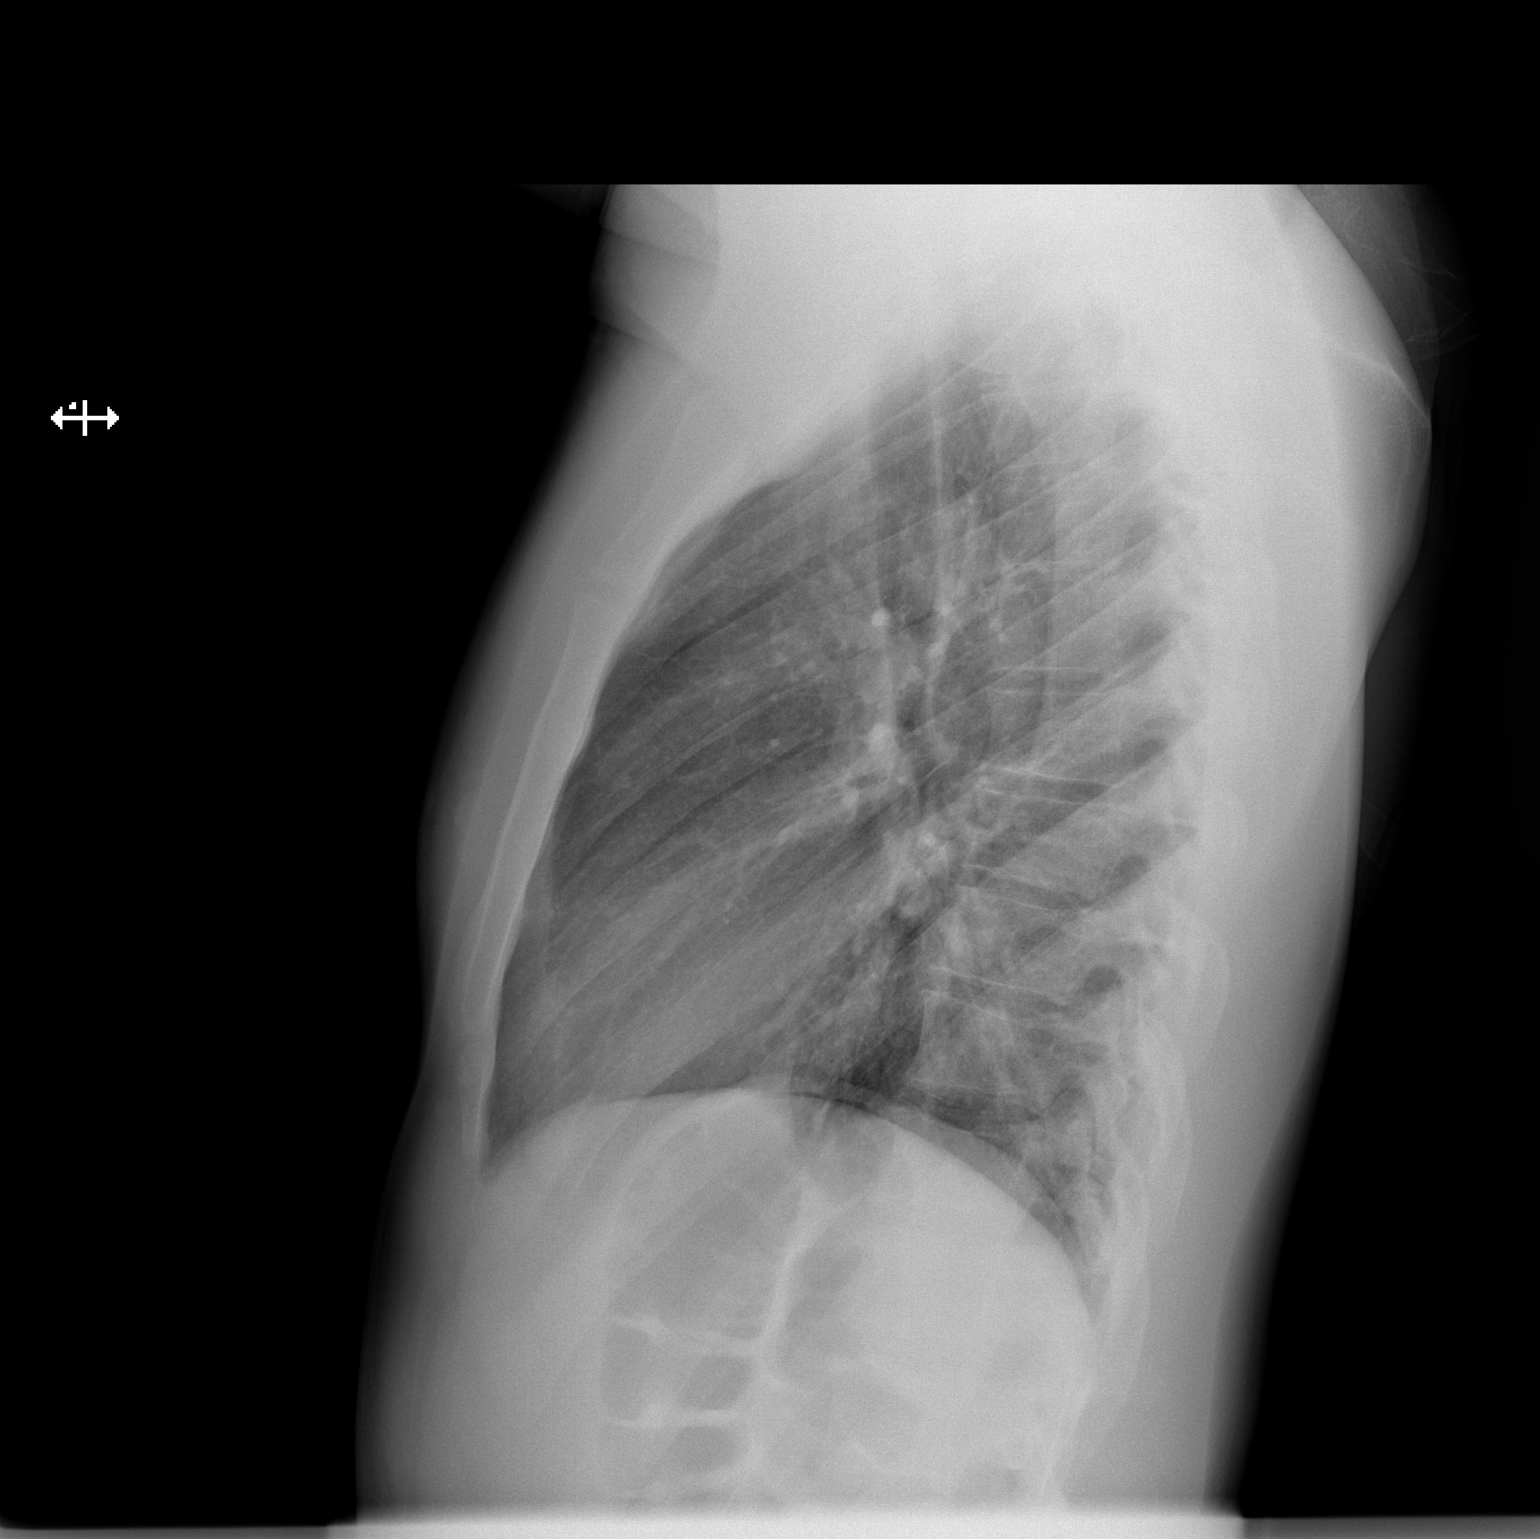

[2 of 2 positions shown; findings below may reference images not displayed]

FINDINGS: The heart size and mediastinal contours are within normal limits.
Both lungs are clear. The visualized skeletal structures are
unremarkable.
IMPRESSION: No active cardiopulmonary disease.

## 2023-02-04 ENCOUNTER — Other Ambulatory Visit: Payer: Self-pay

## 2023-02-04 ENCOUNTER — Emergency Department (HOSPITAL_COMMUNITY)
Admission: EM | Admit: 2023-02-04 | Discharge: 2023-02-04 | Disposition: A | Discharge status: Home / Self Care | Payer: Self-pay | Attending: Emergency Medicine | Admitting: Emergency Medicine

## 2023-02-04 DIAGNOSIS — Z1152 Encounter for screening for COVID-19: Secondary | ICD-10-CM | POA: Insufficient documentation

## 2023-02-04 DIAGNOSIS — J45909 Unspecified asthma, uncomplicated: Secondary | ICD-10-CM | POA: Insufficient documentation

## 2023-02-04 DIAGNOSIS — J069 Acute upper respiratory infection, unspecified: Secondary | ICD-10-CM | POA: Insufficient documentation

## 2023-02-04 LAB — GROUP A STREP BY PCR: Group A Strep by PCR: NOT DETECTED

## 2023-02-04 LAB — SARS CORONAVIRUS 2 BY RT PCR: SARS Coronavirus 2 by RT PCR: NEGATIVE

## 2023-02-04 NOTE — Discharge Instructions (Addendum)
You tested negative for COVID and strep throat today. I recommend close follow-up with PCP for reevaluation.  Please do not hesitate to return to emergency department if worrisome signs symptoms we discussed become apparent.

## 2023-02-04 NOTE — ED Provider Notes (Signed)
[place] [facility] AT [facility] Provider Note   CSN: [id number] Arrival date & time: 02/04/23  0630     History  Chief Complaint  Patient presents with   Cough   Nasal Congestion   Sore Throat    Anthony Hahn is a 27 y.o. male with a history of asthma presents today for evaluation of cold symptoms.  Patient reports that he has had cough, nasal congestions, sore throat since last Sunday.  Patient states that he has felt better and requested a work note.  He denies any fever, nausea vomiting, chest pain, shortness of breath, abdominal pain, bowel change, urinary symptoms.   Cough Sore Throat    Past Medical History:  Diagnosis Date   ADHD    Asthma    pollen-induced   Past Surgical History:  Procedure Laterality Date   ORIF ANKLE FRACTURE  04/16/2011   Procedure: OPEN REDUCTION INTERNAL FIXATION (ORIF) ANKLE FRACTURE;  Surgeon: Velna Ochs, MD;  Location: [facility] OR;  Service: Orthopedics;  Laterality: Left;     Home Medications Prior to Admission medications   Medication Sig Start Date End Date Taking? Authorizing Provider  amoxicillin-clavulanate (AUGMENTIN) 875-125 MG tablet Take 1 tablet by mouth every 12 (twelve) hours. 03/04/20   Roxy Horseman, PA-C  ibuprofen (ADVIL) 800 MG tablet Take 1 tablet (800 mg total) by mouth 3 (three) times daily. 03/04/20   Roxy Horseman, PA-C  loratadine (CLARITIN) 10 MG tablet Take 10 mg by mouth daily as needed. For allergies.     [provider]  methylphenidate (CONCERTA) 18 MG PO CR tablet Take 1 tablet (18 mg total) by mouth daily. Patient not taking: Reported on 09/19/2017 02/09/17   Porfirio Oar, PA  Multiple Vitamins-Minerals (MULTIVITAMINS THER. W/MINERALS) TABS Take 1 tablet by mouth daily.      [provider]  promethazine (PHENERGAN) 25 MG tablet Take 1 tablet (25 mg total) by mouth every 6 (six) hours as needed for nausea or vomiting. 12/23/18   Liberty Handy, PA-C       Allergies    Patient has no known allergies.    Review of Systems   Review of Systems  Respiratory:  Positive for cough.     Physical Exam Updated Vital Signs Ht 6\' 4"  (1.93 m)   Wt 90.7 kg   BMI 24.34 kg/m  Physical Exam Vitals and nursing note reviewed.  Constitutional:      Appearance: Normal appearance.  HENT:     Head: Normocephalic and atraumatic.     Mouth/Throat:     Mouth: Mucous membranes are moist.  Eyes:     General: No scleral icterus. Cardiovascular:     Rate and Rhythm: Normal rate and regular rhythm.     Pulses: Normal pulses.     Heart sounds: Normal heart sounds.  Pulmonary:     Effort: Pulmonary effort is normal.     Breath sounds: Normal breath sounds.  Abdominal:     General: Abdomen is flat.     Palpations: Abdomen is soft.     Tenderness: There is no abdominal tenderness.  Musculoskeletal:        General: No deformity.  Skin:    General: Skin is warm.     Findings: No rash.  Neurological:     General: No focal deficit present.     Mental Status: He is alert.  Psychiatric:        Mood and Affect: Mood normal.  ED Results / Procedures / Treatments   Labs (all labs ordered are listed, but only abnormal results are displayed) Labs Reviewed  GROUP A STREP BY PCR  SARS CORONAVIRUS 2 BY RT PCR    EKG None  Radiology No results found.  Procedures Procedures    Medications Ordered in ED Medications - No data to display  ED Course/ Medical Decision Making/ A&P                                 Medical Decision Making  This patient presents to the ED for sore throat, body aches, this involves an extensive number of treatment options, and is a complaint that carries with a high risk of complications and morbidity.  The differential diagnosis includes flu, COVID, RSV, strep, pharyngitis, bronchitis, pneumonia, infectious etiology.  This is not an exhaustive list.  Lab tests: I ordered and personally interpreted labs.  The  pertinent results include: Viral panel negative for covid, strep, flu.  Imaging studies:  Problem list/ ED course/ Critical interventions/ Medical management: HPI: See above Vital signs within normal range and stable throughout visit. Laboratory/imaging studies significant for: See above. On physical examination, patient is afebrile and appears in no acute distress. This patient presents with symptoms suspicious for viral uri. Based on history and physical doubt sinusitis. COVID test was negative. Do not suspect underlying cardiopulmonary process. I considered, but think unlikely, pneumonia. Patient is nontoxic appearing and not in need of emergent medical intervention. Patient told to self isolate at home until symptoms subside for 48 hours. Recommended patient to take TheraFlu or Mucinex for symptom relief.  Follow-up with primary care physician for further evaluation and management.  Return to the ER if new or worsening symptoms. I have reviewed the patient home medicines and have made adjustments as needed.  Cardiac monitoring/EKG: The patient was maintained on a cardiac monitor.  I personally reviewed and interpreted the cardiac monitor which showed an underlying rhythm of: sinus rhythm.  Additional history obtained: External records from outside source obtained and reviewed including: Chart review including previous notes, labs, imaging.  Consultations obtained:  Disposition Continued outpatient therapy. Follow-up with PCP recommended for reevaluation of symptoms. Treatment plan discussed with patient.  Pt acknowledged understanding was agreeable to the plan. Worrisome signs and symptoms were discussed with patient, and patient acknowledged understanding to return to the ED if they noticed these signs and symptoms. Patient was stable upon discharge.   This chart was dictated using voice recognition software.  Despite best efforts to proofread,  errors can occur which can change the  documentation meaning.          Final Clinical Impression(s) / ED Diagnoses Final diagnoses:  Viral upper respiratory tract infection    Rx / DC Orders ED Discharge Orders     None         Jeanelle Malling, [place] 02/04/23 1021    Elayne Snare K, DO 02/04/23 1211

## 2023-02-04 NOTE — ED Triage Notes (Signed)
Pt. Stated, Anthony Hahn had a cough, congestion and a little discomfort in my throat. My symptoms began last Sunday. I need a note for work
